# Patient Record
Sex: Male | Born: 1965 | Race: Black or African American | Hispanic: No | Marital: Single | Smoking: Never smoker
Health system: Southern US, Community
[De-identification: ages and names within clinical notes are randomized; demographics above are authoritative.]

---

## 2007-11-06 ENCOUNTER — Emergency Department (HOSPITAL_COMMUNITY): Admission: EM | Admit: 2007-11-06 | Discharge: 2007-11-07 | Payer: Self-pay | Admitting: Emergency Medicine

## 2009-02-23 ENCOUNTER — Encounter: Admission: RE | Admit: 2009-02-23 | Discharge: 2009-02-23 | Payer: Self-pay | Admitting: Occupational Medicine

## 2009-02-27 ENCOUNTER — Emergency Department (HOSPITAL_COMMUNITY): Admission: EM | Admit: 2009-02-27 | Discharge: 2009-02-27 | Payer: Self-pay | Admitting: Family Medicine

## 2010-01-19 ENCOUNTER — Emergency Department (HOSPITAL_COMMUNITY): Admission: EM | Admit: 2010-01-19 | Discharge: 2010-01-20 | Payer: Self-pay | Admitting: Emergency Medicine

## 2010-01-22 ENCOUNTER — Inpatient Hospital Stay (HOSPITAL_COMMUNITY): Admission: EM | Admit: 2010-01-22 | Discharge: 2010-01-23 | Payer: Self-pay | Admitting: Emergency Medicine

## 2010-09-30 LAB — COMPREHENSIVE METABOLIC PANEL
ALT: 30 U/L (ref 0–53)
Alkaline Phosphatase: 67 U/L (ref 39–117)
CO2: 13 mEq/L — ABNORMAL LOW (ref 19–32)
CO2: 21 mEq/L (ref 19–32)
Calcium: 9.2 mg/dL (ref 8.4–10.5)
Chloride: 102 mEq/L (ref 96–112)
Chloride: 99 mEq/L (ref 96–112)
Creatinine, Ser: 1.32 mg/dL (ref 0.4–1.5)
GFR calc Af Amer: >60 mL/min (ref >60)
Sodium: 134 mEq/L — ABNORMAL LOW (ref 135–145)
Sodium: 135 mEq/L (ref 135–145)
Total Bilirubin: 1 mg/dL (ref 0.3–1.2)
Total Bilirubin: 1.2 mg/dL (ref 0.3–1.2)
Total Protein: 8.7 g/dL — ABNORMAL HIGH (ref 6.0–8.3)

## 2010-09-30 LAB — URINALYSIS, ROUTINE W REFLEX MICROSCOPIC
Bilirubin Urine: NEGATIVE
Glucose, UA: >1000 mg/dL — AB
Glucose, UA: >1000 mg/dL — AB
Ketones, ur: >80 mg/dL — AB
Ketones, ur: >80 mg/dL — AB
Nitrite: NEGATIVE
Protein, ur: 100 mg/dL — AB
Specific Gravity, Urine: 1.04 — ABNORMAL HIGH (ref 1.005–1.030)
Urobilinogen, UA: 0.2 mg/dL (ref 0.0–1.0)
Urobilinogen, UA: 1 mg/dL (ref 0.0–1.0)
pH: 6 (ref 5.0–8.0)

## 2010-09-30 LAB — LIPID PANEL
Cholesterol: 142 mg/dL (ref 0–200)
Total CHOL/HDL Ratio: 5.9 RATIO

## 2010-09-30 LAB — CBC
HCT: 40.5 % (ref 39.0–52.0)
HCT: 49.4 % (ref 39.0–52.0)
MCH: 30.8 pg (ref 26.0–34.0)
MCH: 31.1 pg (ref 26.0–34.0)
MCHC: 33.5 g/dL (ref 30.0–36.0)
MCHC: 33.7 g/dL (ref 30.0–36.0)
MCV: 92.5 fL (ref 78.0–100.0)
Platelets: 189 10*3/uL (ref 150–400)
Platelets: 214 10*3/uL (ref 150–400)
RBC: 5.34 MIL/uL (ref 4.22–5.81)
RDW: 12.5 % (ref 11.5–15.5)
RDW: 12.7 % (ref 11.5–15.5)

## 2010-09-30 LAB — BASIC METABOLIC PANEL
BUN: 15 mg/dL (ref 6–23)
CO2: 15 mEq/L — ABNORMAL LOW (ref 19–32)
CO2: 19 mEq/L (ref 19–32)
CO2: 25 mEq/L (ref 19–32)
Calcium: 7.5 mg/dL — ABNORMAL LOW (ref 8.4–10.5)
Calcium: 7.8 mg/dL — ABNORMAL LOW (ref 8.4–10.5)
Chloride: 112 mEq/L (ref 96–112)
Chloride: 113 mEq/L — ABNORMAL HIGH (ref 96–112)
Creatinine, Ser: 0.68 mg/dL (ref 0.4–1.5)
GFR calc Af Amer: >60 mL/min (ref >60)
GFR calc Af Amer: >60 mL/min (ref >60)
GFR calc non Af Amer: >60 mL/min (ref >60)
Glucose, Bld: 182 mg/dL — ABNORMAL HIGH (ref 70–99)
Glucose, Bld: 259 mg/dL — ABNORMAL HIGH (ref 70–99)
Potassium: 4.2 mEq/L (ref 3.5–5.1)
Sodium: 138 mEq/L (ref 135–145)
Sodium: 138 mEq/L (ref 135–145)
Sodium: 139 mEq/L (ref 135–145)

## 2010-09-30 LAB — URINE MICROSCOPIC-ADD ON

## 2010-09-30 LAB — DIFFERENTIAL
Basophils Absolute: 0 10*3/uL (ref 0.0–0.1)
Basophils Relative: 0 % (ref 0–1)
Monocytes Relative: 9 % (ref 3–12)

## 2010-09-30 LAB — CK TOTAL AND CKMB (NOT AT ARMC)
CK, MB: 1 ng/mL (ref 0.3–4.0)
CK, MB: 1.2 ng/mL (ref 0.3–4.0)
Relative Index: INVALID (ref 0.0–2.5)
Total CK: 31 U/L (ref 7–232)
Total CK: 33 U/L (ref 7–232)
Total CK: 35 U/L (ref 7–232)

## 2010-09-30 LAB — GLUCOSE, CAPILLARY
Glucose-Capillary: 140 mg/dL — ABNORMAL HIGH (ref 70–99)
Glucose-Capillary: 172 mg/dL — ABNORMAL HIGH (ref 70–99)
Glucose-Capillary: 179 mg/dL — ABNORMAL HIGH (ref 70–99)
Glucose-Capillary: 181 mg/dL — ABNORMAL HIGH (ref 70–99)
Glucose-Capillary: 243 mg/dL — ABNORMAL HIGH (ref 70–99)

## 2010-09-30 LAB — POCT I-STAT 3, ART BLOOD GAS (G3+)
TCO2: 13 mmol/L (ref 0–100)
pCO2 arterial: 28 mmHg — ABNORMAL LOW (ref 35.0–45.0)
pO2, Arterial: 103 mmHg — ABNORMAL HIGH (ref 80.0–100.0)

## 2010-09-30 LAB — HEMOGLOBIN A1C: Mean Plasma Glucose: 430 mg/dL — ABNORMAL HIGH (ref <117)

## 2010-09-30 LAB — TROPONIN I: Troponin I: 0.01 ng/mL (ref 0.00–0.06)

## 2010-09-30 LAB — LIPASE, BLOOD
Lipase: 22 U/L (ref 11–59)
Lipase: 31 U/L (ref 11–59)

## 2010-09-30 LAB — HEMOCCULT GUIAC POC 1CARD (OFFICE): Fecal Occult Bld: NEGATIVE

## 2010-10-20 LAB — GLUCOSE, CAPILLARY: Glucose-Capillary: 351 mg/dL — ABNORMAL HIGH (ref 70–99)

## 2011-03-24 ENCOUNTER — Emergency Department (HOSPITAL_COMMUNITY)
Admission: EM | Admit: 2011-03-24 | Discharge: 2011-03-24 | Disposition: A | Discharge status: Home / Self Care | Payer: BC Managed Care – PPO | Attending: Emergency Medicine | Admitting: Emergency Medicine

## 2011-03-24 DIAGNOSIS — X12XXXA Contact with other hot fluids, initial encounter: Secondary | ICD-10-CM | POA: Insufficient documentation

## 2011-03-24 DIAGNOSIS — T310 Burns involving less than 10% of body surface: Secondary | ICD-10-CM | POA: Insufficient documentation

## 2011-03-24 DIAGNOSIS — Z794 Long term (current) use of insulin: Secondary | ICD-10-CM | POA: Insufficient documentation

## 2011-03-24 DIAGNOSIS — T22239A Burn of second degree of unspecified upper arm, initial encounter: Secondary | ICD-10-CM | POA: Insufficient documentation

## 2011-03-24 DIAGNOSIS — E119 Type 2 diabetes mellitus without complications: Secondary | ICD-10-CM | POA: Insufficient documentation

## 2011-03-24 DIAGNOSIS — Y9289 Other specified places as the place of occurrence of the external cause: Secondary | ICD-10-CM | POA: Insufficient documentation

## 2011-06-05 IMAGING — CT CT ABD-PELV W/ CM
2 of 5 series · 14 of 32 positions shown, 19 images · IV contrast (agent unspecified)
Comparison: None.

CLINICAL DATA: Lower abdominal pain, nausea, vomiting

CT ABDOMEN AND PELVIS WITH CONTRAST
TECHNIQUE: Multidetector CT imaging of the abdomen and pelvis was
performed following the standard protocol during bolus
administration of intravenous contrast. Breast shield utilized.
Sagittal and coronal MPR images reconstructed from axial data set.
Contrast: 80 ml Xmnipaque-2JJ IV, dilute oral contrast

[Series 2: routine abdomen · axial · 0.70mm/px · z∈[-432,-82]mm · 7 of 94 slices shown, 12 images]
[im 12/94  soft-tissue]
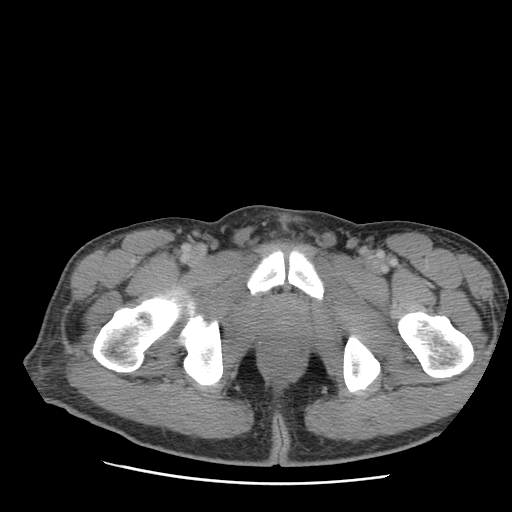
[im 12/94  bone]
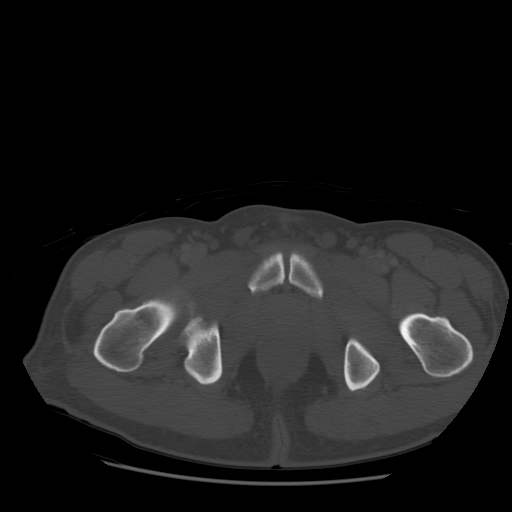
[im 24/94  soft-tissue]
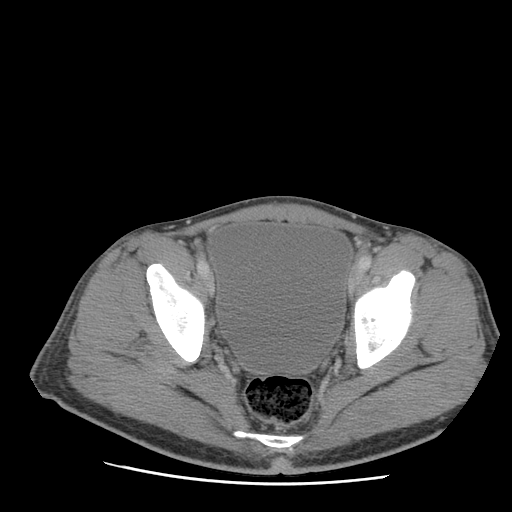
[im 35/94  soft-tissue]
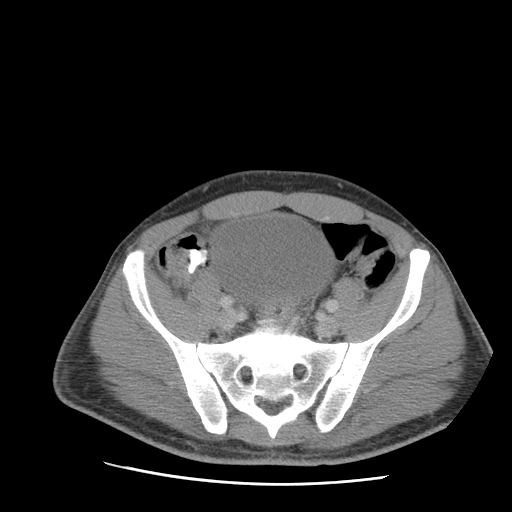
[im 47/94  soft-tissue]
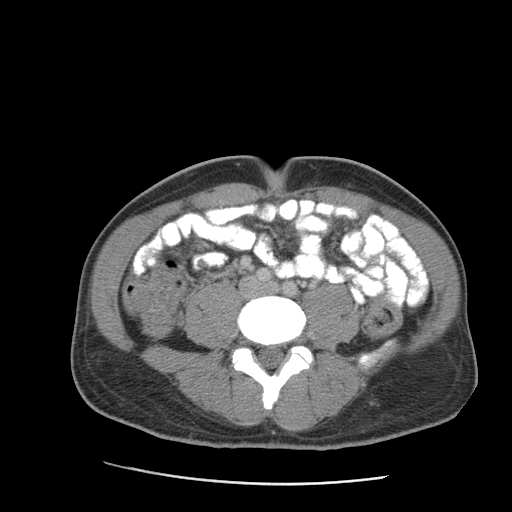
[im 47/94  lung]
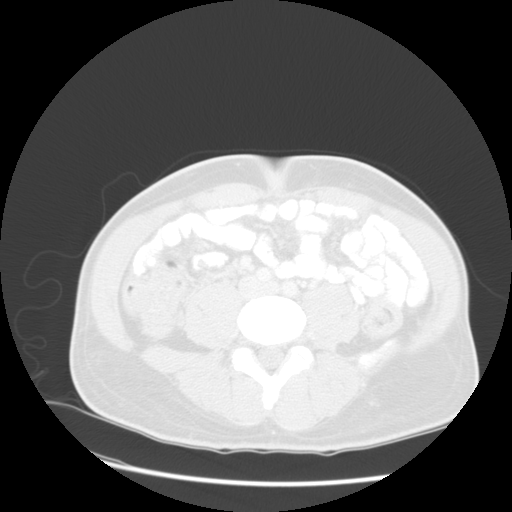
[im 59/94  soft-tissue]
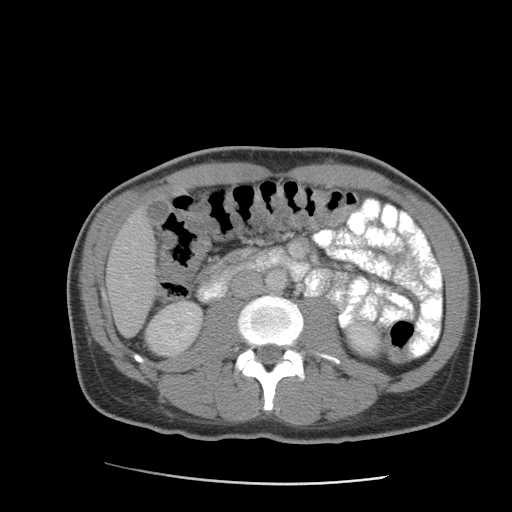
[im 59/94  lung]
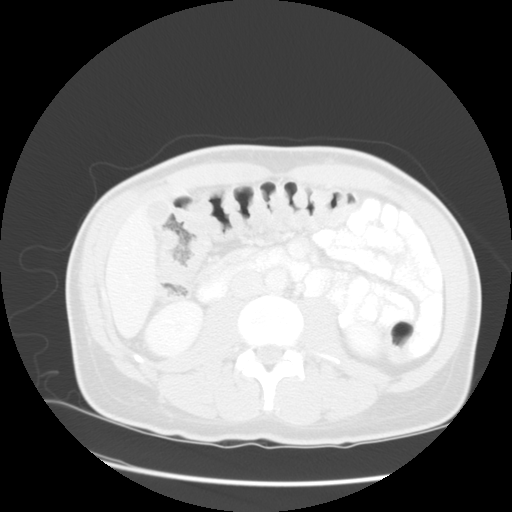
[im 70/94  soft-tissue]
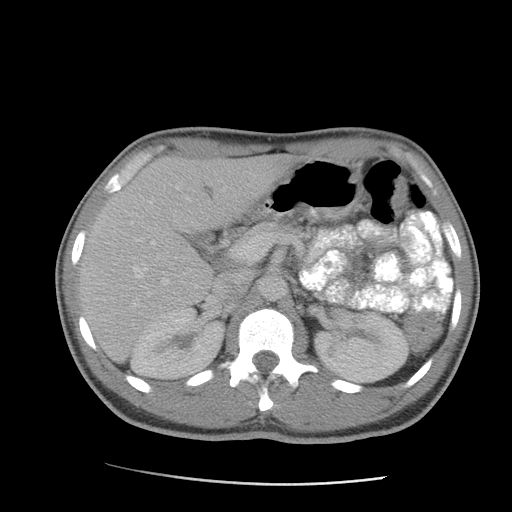
[im 70/94  lung]
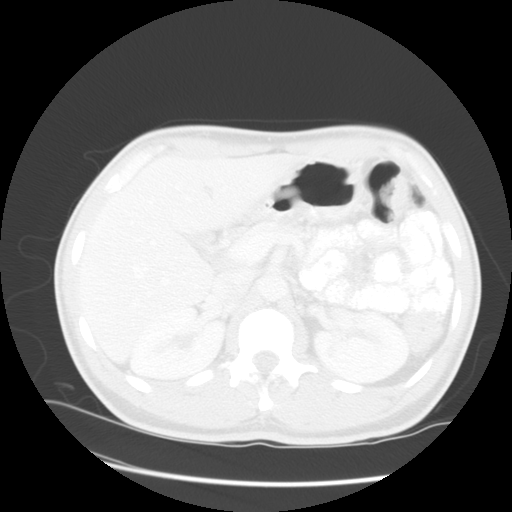
[im 82/94  soft-tissue]
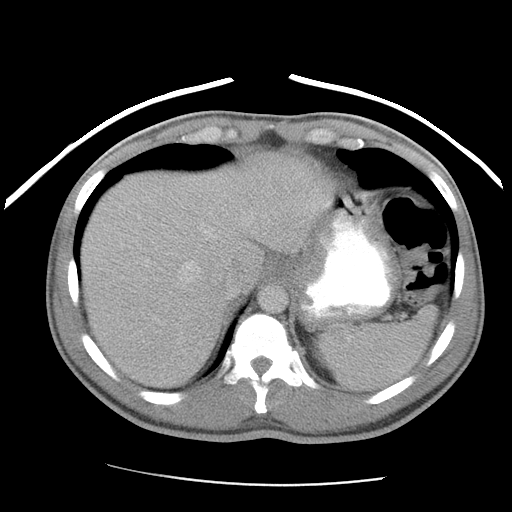
[im 82/94  lung]
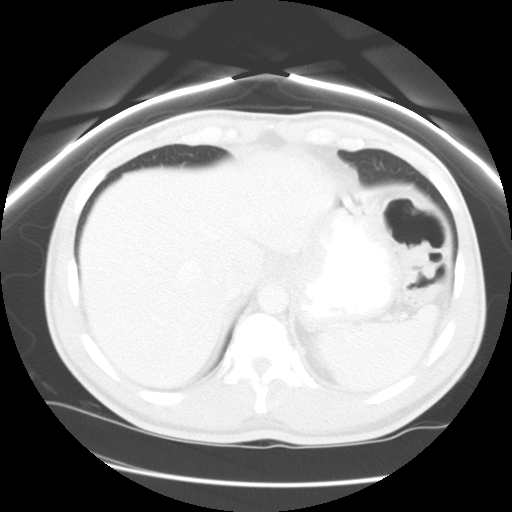

[Series 400: reformatted · sagittal · 0.93mm/px · 7 of 102 slices shown]
[im 12/102  soft-tissue]
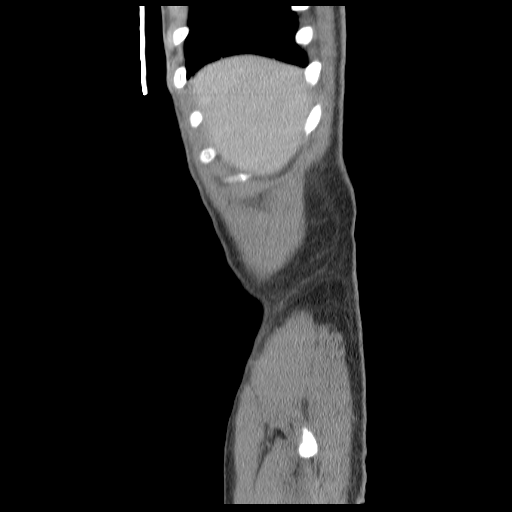
[im 23/102  soft-tissue]
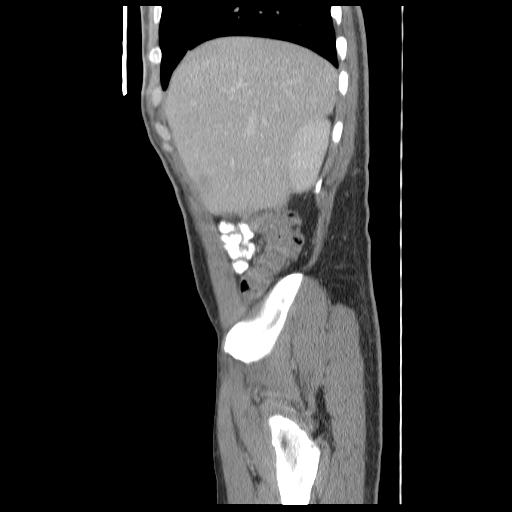
[im 34/102  soft-tissue]
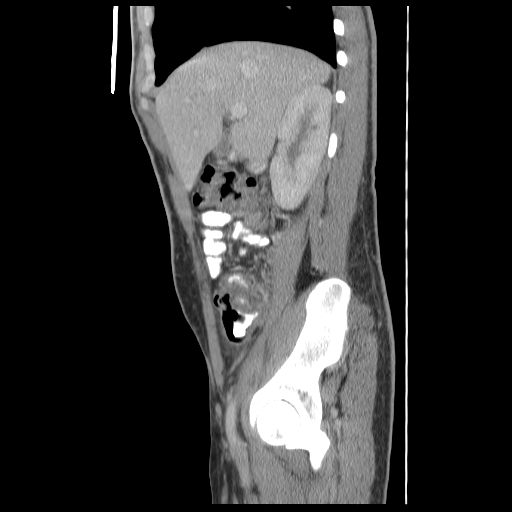
[im 45/102  soft-tissue]
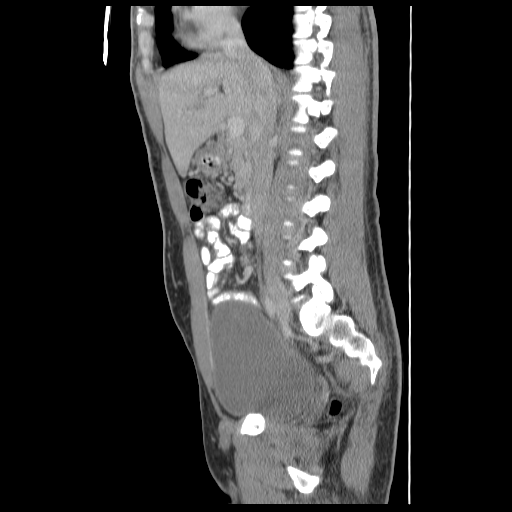
[im 57/102  soft-tissue]
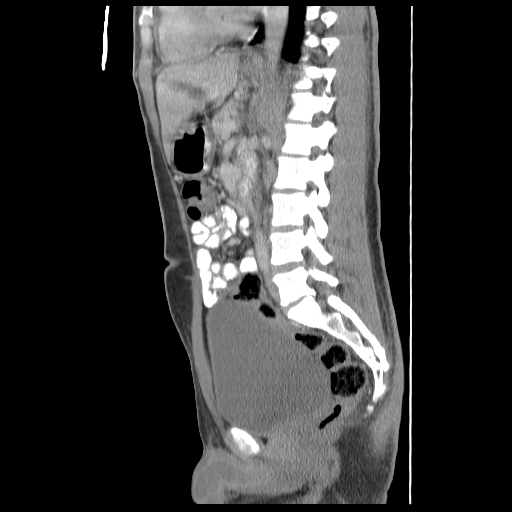
[im 68/102  soft-tissue]
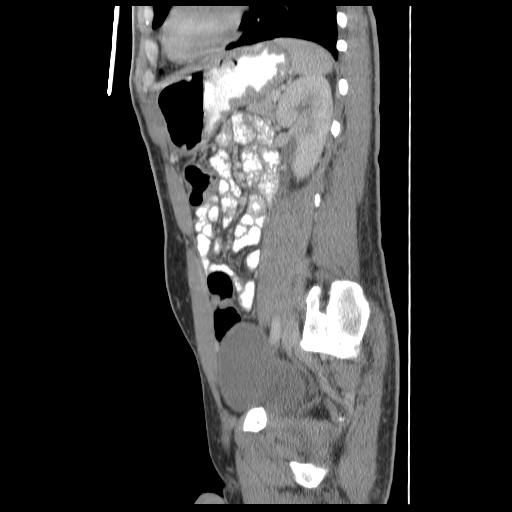
[im 79/102  soft-tissue]
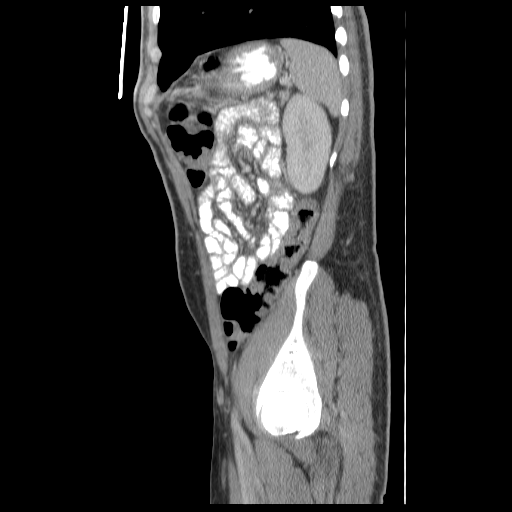

[14 of 32 positions shown; findings below may reference images not displayed]

FINDINGS: Lung bases clear.
Liver, spleen, pancreas, and adrenal glands normal appearance.
Symmetric nephrograms with extrarenal pelvis left kidney.
Well-distended normal-appearing urinary bladder.
Appendix appears coiled adjacent the cecum, grossly normal
appearance.
Colon wall is prominent thickness at scattered sites, most
prominent at the cecum and ascending colon, colitis not completely
excluded though this could represent underdistension artifact.
Small bowel loops unremarkable.
Few normal-sized lymph nodes in mesentery medial to the right
colon, nonspecific.
No mass, adenopathy, free fluid, or inflammatory process.
Scattered pelvic phleboliths.
No acute bony findings.
IMPRESSION: No definite acute intra abdominal abnormalities.
Questionable wall thickening versus underdistension artifact of
cecum and ascending colon, unable to completely exclude colitis.

## 2012-03-07 ENCOUNTER — Encounter (HOSPITAL_COMMUNITY): Payer: Self-pay | Admitting: Family Medicine

## 2012-03-07 ENCOUNTER — Emergency Department (HOSPITAL_COMMUNITY)
Admission: EM | Admit: 2012-03-07 | Discharge: 2012-03-07 | Disposition: A | Discharge status: Home / Self Care | Payer: BC Managed Care – PPO | Attending: Emergency Medicine | Admitting: Emergency Medicine

## 2012-03-07 DIAGNOSIS — B9789 Other viral agents as the cause of diseases classified elsewhere: Secondary | ICD-10-CM | POA: Insufficient documentation

## 2012-03-07 DIAGNOSIS — E119 Type 2 diabetes mellitus without complications: Secondary | ICD-10-CM | POA: Insufficient documentation

## 2012-03-07 DIAGNOSIS — J029 Acute pharyngitis, unspecified: Secondary | ICD-10-CM | POA: Insufficient documentation

## 2012-03-07 DIAGNOSIS — Z794 Long term (current) use of insulin: Secondary | ICD-10-CM | POA: Insufficient documentation

## 2012-03-07 MED ORDER — HYDROCODONE-ACETAMINOPHEN 7.5-500 MG/15ML PO SOLN: 15.0000 mL | Freq: Four times a day (QID) | ORAL | Status: DC | PRN

## 2012-03-07 NOTE — ED Provider Notes (Signed)
Medical screening examination/treatment/procedure(s) were performed by non-physician practitioner and as supervising physician I was immediately available for consultation/collaboration.   Richardean Canal, MD 03/07/12 2017

## 2012-03-07 NOTE — ED Provider Notes (Signed)
History     CSN: [id number]  Arrival date & time 03/07/12  1631   First MD Initiated Contact with Patient 03/07/12 1654      Chief Complaint  Patient presents with  . Sore Throat    (Consider location/radiation/quality/duration/timing/severity/associated sxs/prior treatment) HPI  46 year old male presents c/o sore throat.  Reports 2 days gradual onset of throat irritation. Describe sensation as a soreness, worsening with swallowing and eating. Pain is to the back of his throat. Endorse chills, and neck pain, with a mild headache. Denies ear pain, sneezing, coughing, chest pain, shortness of breath, or rash. Has tried over-the-counter throat spray with minimal relief.  Patient is a nonsmoker. He denies abdominal pain.  Past Medical History  Diagnosis Date  . Diabetes mellitus     History reviewed. No pertinent past surgical history.  History reviewed. No pertinent family history.  History  Substance Use Topics  . Smoking status: Never Smoker   . Smokeless tobacco: Not on file  . Alcohol Use: No      Review of Systems  Constitutional: Positive for chills. Negative for fever.  HENT: Positive for sore throat, trouble swallowing and neck pain. Negative for ear pain, facial swelling, sneezing, mouth sores, neck stiffness and voice change.   Skin: Negative for rash.  Neurological: Negative for speech difficulty.    Allergies  Review of patient's allergies indicates no known allergies.  Home Medications   Current Outpatient Rx  Name Route Sig Dispense Refill  . ASPIRIN EC 81 MG PO TBEC Oral Take 81 mg by mouth daily.    . INSULIN ASPART 100 UNIT/ML [place] SOLN Subcutaneous Inject 6-7 Units into the skin 3 (three) times daily before meals.    Marland Kitchen SIMVASTATIN 5 MG PO TABS Oral Take 5 mg by mouth at bedtime.      BP 161/84  Pulse 84  Temp 99.8 F (37.7 C) (Oral)  Resp 20  SpO2 100%  Physical Exam  Nursing note and vitals reviewed. Constitutional: He is oriented to  person, place, and time. He appears well-developed and well-nourished. No distress.  HENT:  Head: Normocephalic and atraumatic.  Right Ear: External ear normal.  Left Ear: External ear normal.  Nose: Nose normal.  Mouth/Throat: Oropharynx is clear and moist. No oropharyngeal exudate.       Oral pharyngeal mildly erythematous without exudates, or tonsillar enlargement. Uvula mildly edematous. No evidence of peritonsillar abscess, or Ludwig angina  Eyes: Conjunctivae are normal.  Neck: Normal range of motion. Neck supple. No tracheal deviation present.  Cardiovascular: Normal rate.   Pulmonary/Chest: Effort normal and breath sounds normal. No stridor. No respiratory distress. He exhibits no tenderness.  Abdominal: Soft. He exhibits no mass. There is no tenderness.  Lymphadenopathy:    He has cervical adenopathy.  Neurological: He is alert and oriented to person, place, and time.  Skin: Skin is warm. No rash noted.  Psychiatric: He has a normal mood and affect.    ED Course  Procedures (including critical care time)   Labs Reviewed  RAPID STREP SCREEN   No results found.   No diagnosis found.  Results for orders placed during the hospital encounter of 03/07/12  RAPID STREP SCREEN      Component Value Range   Streptococcus, Group A Screen (Direct) NEGATIVE  NEGATIVE   No results found.  1. Viral pharyngitis  MDM  Sore throat x2 days. Patient is afebrile. Vital signs stable. No evidence of airway obstruction. Strep test ordered  5:19 PM Strep test neg.  Care instruction given.  Lortab elixir prescribed.  ENT referral given.  Pt able to tolerates PO.    Fayrene Helper, PA-C 03/07/12 1720

## 2012-03-07 NOTE — ED Notes (Signed)
sts 2 days of sore throat and hard to swallow. Pt also complaining of bite to lip.

## 2012-03-09 ENCOUNTER — Encounter (HOSPITAL_COMMUNITY): Payer: Self-pay | Admitting: Family Medicine

## 2012-03-09 ENCOUNTER — Emergency Department (HOSPITAL_COMMUNITY)
Admission: EM | Admit: 2012-03-09 | Discharge: 2012-03-09 | Disposition: A | Discharge status: Home / Self Care | Payer: BC Managed Care – PPO | Attending: Emergency Medicine | Admitting: Emergency Medicine

## 2012-03-09 DIAGNOSIS — E119 Type 2 diabetes mellitus without complications: Secondary | ICD-10-CM | POA: Insufficient documentation

## 2012-03-09 DIAGNOSIS — K122 Cellulitis and abscess of mouth: Secondary | ICD-10-CM | POA: Insufficient documentation

## 2012-03-09 DIAGNOSIS — J029 Acute pharyngitis, unspecified: Secondary | ICD-10-CM | POA: Insufficient documentation

## 2012-03-09 DIAGNOSIS — Z79899 Other long term (current) drug therapy: Secondary | ICD-10-CM | POA: Insufficient documentation

## 2012-03-09 DIAGNOSIS — Z794 Long term (current) use of insulin: Secondary | ICD-10-CM | POA: Insufficient documentation

## 2012-03-09 LAB — RAPID STREP SCREEN (MED CTR MEBANE ONLY): Streptococcus, Group A Screen (Direct): NEGATIVE

## 2012-03-09 MED ORDER — PREDNISONE 20 MG PO TABS: ORAL_TABLET | ORAL | Status: AC

## 2012-03-09 MED ORDER — PREDNISONE 20 MG PO TABS: ORAL_TABLET | ORAL | Status: DC

## 2012-03-09 MED ORDER — AMOXICILLIN 500 MG PO CAPS: 500.0000 mg | ORAL_CAPSULE | Freq: Three times a day (TID) | ORAL | Status: AC

## 2012-03-09 MED ORDER — AMOXICILLIN 500 MG PO CAPS: 500.0000 mg | ORAL_CAPSULE | Freq: Three times a day (TID) | ORAL | Status: DC

## 2012-03-09 NOTE — ED Provider Notes (Signed)
History     CSN: [id number]  Arrival date & time 03/09/12  1446   First MD Initiated Contact with Patient 03/09/12 1517      Chief Complaint  Patient presents with  . Sore Throat    (Consider location/radiation/quality/duration/timing/severity/associated sxs/prior treatment) HPI Comments: 46 y/o male presents with sore throat since Thursday. Was seen in ED on Saturday and diagnosed with viral pharyngitis. He was given liquid hydrocodone-acetaminophen which only provides temporary relief and he thinks his throat is getting worse. Admits to associated fever, chills, and discomfort with swallowing. Denies any cough, congestion, chest pain, sob, rashes. Also tried many OTC cold medications without relief.  The history is provided by the patient.    Past Medical History  Diagnosis Date  . Diabetes mellitus     History reviewed. No pertinent past surgical history.  History reviewed. No pertinent family history.  History  Substance Use Topics  . Smoking status: Never Smoker   . Smokeless tobacco: Not on file  . Alcohol Use: No      Review of Systems  Constitutional: Positive for fever and chills.  HENT: Positive for sore throat. Negative for congestion.        Positive for discomfort with swallowing  Respiratory: Negative for shortness of breath.   Cardiovascular: Negative for chest pain.  Skin: Negative for rash.    Allergies  Review of patient's allergies indicates no known allergies.  Home Medications   Current Outpatient Rx  Name Route Sig Dispense Refill  . ASPIRIN EC 81 MG PO TBEC Oral Take 81 mg by mouth daily.    Marland Kitchen HYDROCODONE-ACETAMINOPHEN 7.5-500 MG/15ML PO SOLN Oral Take 15 mLs by mouth every 6 (six) hours as needed. For pain    . INSULIN ASPART 100 UNIT/ML [place] SOLN Subcutaneous Inject 6-7 Units into the skin 3 (three) times daily before meals.    Marland Kitchen SIMVASTATIN 5 MG PO TABS Oral Take 5 mg by mouth at bedtime.    . AMOXICILLIN 500 MG PO CAPS Oral Take 1  capsule (500 mg total) by mouth 3 (three) times daily. 21 capsule 0  . PREDNISONE 20 MG PO TABS  Take 3 tabs PO x 2 days followed by 2 tabs PO x 2 days followed by 1 tab PO x 2 days 12 tablet 0    BP 139/88  Pulse 114  Temp 101.2 F (38.4 C) (Oral)  Resp 16  Ht 5\' 8"  (1.727 m)  Wt 160 lb (72.576 kg)  BMI 24.33 kg/m2  SpO2 99%  Physical Exam  Constitutional: He is oriented to person, place, and time. He appears well-developed and well-nourished. No distress.  HENT:  Head: Normocephalic and atraumatic.  Nose: Nose normal.  Mouth/Throat: Uvula is midline and mucous membranes are normal. Uvula swelling present. Posterior oropharyngeal edema and posterior oropharyngeal erythema present. No oropharyngeal exudate or tonsillar abscesses.  Eyes: Conjunctivae and EOM are normal.  Neck: Trachea normal. Neck supple.  Cardiovascular: Regular rhythm and normal heart sounds.  Tachycardia present.   Pulmonary/Chest: Effort normal and breath sounds normal.  Lymphadenopathy:       Head (right side): Submandibular and tonsillar adenopathy present.       Head (left side): Submandibular and tonsillar adenopathy present.  Neurological: He is alert and oriented to person, place, and time.  Skin: Skin is warm and dry. No rash noted.  Psychiatric: He has a normal mood and affect. His speech is normal and behavior is normal.    ED Course  Procedures (  including critical care time)   Labs Reviewed  RAPID STREP SCREEN   Results for orders placed during the hospital encounter of 03/09/12  RAPID STREP SCREEN      Component Value Range   Streptococcus, Group A Screen (Direct) NEGATIVE  NEGATIVE    No results found.   1. Pharyngitis   2. Uvulitis       MDM  46 y/o male with sore throat. Conservative measures unsuccessful. Will treat with prednisone for uvula swelling and amoxicillin. No signs of tonsillar abscess.        Trevor Mace, PA-C 03/09/12 1552

## 2012-03-09 NOTE — ED Notes (Signed)
C/o sore throat since Thursday, states progressively worsening. Started fever & now c/o h/a today

## 2012-03-09 NOTE — ED Notes (Signed)
Per pt was seen here on Saturday for the same. sts sore throat, swelling and very hard to swallow. sts has gotten worse.

## 2012-03-10 NOTE — ED Provider Notes (Signed)
Medical screening examination/treatment/procedure(s) were performed by non-physician practitioner and as supervising physician I was immediately available for consultation/collaboration.   Teigan Sahli Y. Santresa Levett, MD 03/10/12 1621 

## 2012-04-23 ENCOUNTER — Emergency Department (HOSPITAL_COMMUNITY)
Admission: EM | Admit: 2012-04-23 | Discharge: 2012-04-23 | Disposition: A | Discharge status: Home / Self Care | Payer: BC Managed Care – PPO | Attending: Emergency Medicine | Admitting: Emergency Medicine

## 2012-04-23 ENCOUNTER — Emergency Department (HOSPITAL_COMMUNITY): Payer: BC Managed Care – PPO

## 2012-04-23 ENCOUNTER — Encounter (HOSPITAL_COMMUNITY): Payer: Self-pay | Admitting: Emergency Medicine

## 2012-04-23 DIAGNOSIS — Z794 Long term (current) use of insulin: Secondary | ICD-10-CM | POA: Insufficient documentation

## 2012-04-23 DIAGNOSIS — E1169 Type 2 diabetes mellitus with other specified complication: Secondary | ICD-10-CM | POA: Insufficient documentation

## 2012-04-23 DIAGNOSIS — L03119 Cellulitis of unspecified part of limb: Secondary | ICD-10-CM | POA: Insufficient documentation

## 2012-04-23 DIAGNOSIS — Z7982 Long term (current) use of aspirin: Secondary | ICD-10-CM | POA: Insufficient documentation

## 2012-04-23 DIAGNOSIS — R739 Hyperglycemia, unspecified: Secondary | ICD-10-CM

## 2012-04-23 DIAGNOSIS — L02619 Cutaneous abscess of unspecified foot: Secondary | ICD-10-CM | POA: Insufficient documentation

## 2012-04-23 DIAGNOSIS — L03115 Cellulitis of right lower limb: Secondary | ICD-10-CM

## 2012-04-23 LAB — POCT I-STAT, CHEM 8
BUN: 14 mg/dL (ref 6–23)
Calcium, Ion: 1.24 mmol/L — ABNORMAL HIGH (ref 1.12–1.23)
Chloride: 103 mEq/L (ref 96–112)
Creatinine, Ser: 0.8 mg/dL (ref 0.50–1.35)
Glucose, Bld: 505 mg/dL — ABNORMAL HIGH (ref 70–99)
HCT: 42 % (ref 39.0–52.0)
Potassium: 4.6 mEq/L (ref 3.5–5.1)
TCO2: 25 mmol/L (ref 0–100)

## 2012-04-23 MED ORDER — CEPHALEXIN 250 MG PO CAPS
500.0000 mg | ORAL_CAPSULE | Freq: Once | ORAL | Status: AC
  Administered 2012-04-23: 500 mg via ORAL
  Filled 2012-04-23: qty 2

## 2012-04-23 MED ORDER — CEPHALEXIN 500 MG PO CAPS: 500.0000 mg | ORAL_CAPSULE | Freq: Four times a day (QID) | ORAL | Status: AC

## 2012-04-23 MED ORDER — HYDROCODONE-ACETAMINOPHEN 5-325 MG PO TABS: 1.0000 | ORAL_TABLET | ORAL | Status: AC | PRN

## 2012-04-23 NOTE — ED Provider Notes (Signed)
History     CSN: [id number]  Arrival date & time 04/23/12  0003   First MD Initiated Contact with Patient 04/23/12 [phone]      Chief Complaint  Patient presents with  . Foot Pain    (Consider location/radiation/quality/duration/timing/severity/associated sxs/prior treatment) HPI History provided by pt.   Pt had mild pain in right 4th toe while at work this evening and attributed to friction from his boot.  Woke at mid-night w/ severe pain in this toe as well as dorsal surface of forefoot, with associated edema.  Denies fever.  Pain aggravated by bearing weight and has to walk on the lateral side of foot.  Denies trauma. Pt has insulin-dependent diabetes.    Past Medical History  Diagnosis Date  . Diabetes mellitus     History reviewed. No pertinent past surgical history.  History reviewed. No pertinent family history.  History  Substance Use Topics  . Smoking status: Never Smoker   . Smokeless tobacco: Not on file  . Alcohol Use: No      Review of Systems  All other systems reviewed and are negative.    Allergies  Review of patient's allergies indicates no known allergies.  Home Medications   Current Outpatient Rx  Name Route Sig Dispense Refill  . ASPIRIN EC 81 MG PO TBEC Oral Take 81 mg by mouth daily.    . INSULIN ASPART 100 UNIT/ML [place] SOLN Subcutaneous Inject 6-7 Units into the skin 3 (three) times daily before meals.    Marland Kitchen SIMVASTATIN 5 MG PO TABS Oral Take 5 mg by mouth at bedtime.      BP 152/95  Pulse 97  Temp 98.5 F (36.9 C) (Oral)  Resp 18  SpO2 98%  Physical Exam  Nursing note and vitals reviewed. Constitutional: He is oriented to person, place, and time. He appears well-developed and well-nourished. No distress.  HENT:  Head: Normocephalic and atraumatic.  Eyes:       Normal appearance  Neck: Normal range of motion.  Pulmonary/Chest: Effort normal.  Musculoskeletal: Normal range of motion.       Right foot diffusely edematous.  No edema  of lower leg. Blister on dorsolateral fourth toe that is ttp.  Erythema and mild warmth to touch of this toe and dorsal forefoot.  Pain w/ passive flexion of fourth toe.  No pain w/ ROM of ankle.  2+ DP pulse and distal sensation intact.    Neurological: He is alert and oriented to person, place, and time.  Psychiatric: He has a normal mood and affect. His behavior is normal.    ED Course  Procedures (including critical care time)  Labs Reviewed  GLUCOSE, CAPILLARY - Abnormal; Notable for the following:    Glucose-Capillary 473 (*)     All other components within normal limits  POCT I-STAT, CHEM 8 - Abnormal; Notable for the following:    Glucose, Bld 505 (*)     Calcium, Ion 1.24 (*)     All other components within normal limits   Dg Foot Complete Right  04/23/2012  *RADIOLOGY REPORT*  Clinical Data: Left foot pain and swelling especially about the fourth toe, no known injury  RIGHT FOOT COMPLETE - 3+ VIEW  Comparison: None.  Findings:  No fracture or dislocation with special attention paid to the fourth digit.  Regional soft tissues are normal.  No radiopaque foreign body.  Joint spaces are preserved.  No definite erosions. Pes planus deformity suspected on this nonweightbearing radiograph.  IMPRESSION:  No fracture.   Original Report Authenticated By: Waynard Reeds, M.D.      1. Cellulitis of right foot   2. Hyperglycemia       MDM  Insulin-dependent diabetic presents w/ non-traumatic Right foot edema, erythema and pain.  S/sx most consistent w/ cellulitis.  Because patient is immunocompromised and has pain w/ weight-bearing, xray ordered to r/o osteomyelitis.  cbg 473.  Chem 8 ordered to r/o DKA.  1:06 AM   Xray neg; pt unlikely to have osteomyelitis.  Ortho tech provided him w/ crutches and keflex and vicodin prescribed.  He is not acidotic.  Just took his lantus and pm dose of novolog here in ED.  Recommended that he monitor closely at home.  Strict return precautions  discussed.       Otilio Miu, [place] 04/23/12 [phone]

## 2012-04-23 NOTE — ED Notes (Signed)
Patient transported to X-ray 

## 2012-04-23 NOTE — ED Notes (Signed)
(+)  Swelling, redness, tenderness still noted to right foot. Patient denies pain while resting.  Per patient report, pain returns when bearing weight on foot.

## 2012-04-23 NOTE — ED Notes (Signed)
Patient states he wears steel toe shoes for work.  Today when he went to work he put on his socks and boots and went to work.  When he came home he showered sat down and put his feet up ob the coffee table.  States he fell asleep for a short time and when he woke up his right foot was swollen and painful and is fourth toe has an area on the top the is yellowish in color.  His toe is very painful, unable to touch it.

## 2012-04-23 NOTE — ED Notes (Signed)
CBG=473mg /dl

## 2012-04-23 NOTE — ED Provider Notes (Signed)
Medical screening examination/treatment/procedure(s) were performed by non-physician practitioner and as supervising physician I was immediately available for consultation/collaboration.  Raeford Razor, MD 04/23/12 [phone]

## 2012-04-23 NOTE — ED Notes (Signed)
Patient reports sudden onset of swelling and pain to right foot onset at 2330 tonight.  Patient reports that pain awoke him from sleep.  Patient describes the pain as throbbing/aching/heavines in his right foot.  Pulses present in right foot and right lower extremity.  Decrease ROM noted to right foot secondary to swelling.  Moderate swelling, redness, tenderness noted to dorsal surface of right foot.  Abnormality noted to fourth digit of right foot as well.

## 2012-04-23 NOTE — ED Notes (Signed)
Patient transported from X-ray 

## 2012-04-23 NOTE — ED Notes (Signed)
Patient reports that while he was at work tonight, he started having pain in the fourth toe on the right foot while walking at work.  Patient denies injury.  After patient went home and took a shower, he noticed swelling around the toe.  Insect bite/blister noted to fourth toe on right foot.  Patient ambulatory in triage.

## 2012-04-23 NOTE — Progress Notes (Signed)
Orthopedic Tech Progress Note Patient Details:  Robert Jarvis 1966/07/08 [id number]  Ortho Devices Type of Ortho Device: Crutches   Haskell Flirt 04/23/2012, 4:25 AM

## 2015-01-25 ENCOUNTER — Emergency Department (HOSPITAL_COMMUNITY)
Admission: EM | Admit: 2015-01-25 | Discharge: 2015-01-25 | Disposition: A | Discharge status: Home / Self Care | Payer: BLUE CROSS/BLUE SHIELD | Source: Home / Self Care | Attending: Family Medicine | Admitting: Family Medicine

## 2015-01-25 ENCOUNTER — Encounter (HOSPITAL_COMMUNITY): Payer: Self-pay | Admitting: *Deleted

## 2015-01-25 DIAGNOSIS — M79671 Pain in right foot: Secondary | ICD-10-CM | POA: Diagnosis not present

## 2015-01-25 NOTE — Discharge Instructions (Signed)
See podiatrist as advised for further foot care.

## 2015-01-25 NOTE — ED Provider Notes (Signed)
CSN: [id number]     Arrival date & time 01/25/15  1351 History   First MD Initiated Contact with Patient 01/25/15 1449     Chief Complaint  Patient presents with  . Foot Problem   (Consider location/radiation/quality/duration/timing/severity/associated sxs/prior Treatment) Patient is a 49 y.o. male presenting with lower extremity pain. The history is provided by the patient.  Foot Pain This is a new problem. The current episode started yesterday. The problem has been gradually worsening. 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 The symptoms are aggravated by walking.    Past Medical History  Diagnosis Date  . Diabetes mellitus    History reviewed. No pertinent past surgical history. History reviewed. No pertinent family history. History  Substance Use Topics  . Smoking status: Never Smoker   . Smokeless tobacco: Not on file  . Alcohol Use: No    Review of Systems  Constitutional: Negative.   Musculoskeletal: Positive for gait problem.  Skin: Positive for wound.    Allergies  Review of patient's allergies indicates no known allergies.  Home Medications   Prior to Admission medications   Medication Sig Start Date End Date Taking? Authorizing Provider  aspirin EC 81 MG tablet Take 81 mg by mouth daily.    Historical Provider, MD  cephALEXin (KEFLEX) 500 MG capsule Take 1 capsule (500 mg total) by mouth 4 (four) times daily. 04/23/12   Ruby Colaatherine Schinlever, PA-C  HYDROcodone-acetaminophen (NORCO/VICODIN) 5-325 MG per tablet Take 1 tablet by mouth every 4 (four) hours as needed for pain. 04/23/12   Catherine Schinlever, PA-C  insulin aspart (NOVOLOG) 100 UNIT/ML injection Inject 6-7 Units into the skin 3 (three) times daily before meals.    Historical Provider, MD  simvastatin (ZOCOR) 5 MG tablet Take 5 mg by mouth at bedtime.    Historical Provider, MD   BP 135/85 mmHg  Pulse 102  Temp(Src) 98.5 F (36.9 C) (Oral)  Resp  16  SpO2 98% Physical Exam  Constitutional: He is oriented to person, place, and time. He appears well-developed and well-nourished. No distress.  Musculoskeletal: He exhibits tenderness.  Tender callous thickening to plantar aspect of right lat foot, no infection.  Neurological: He is alert and oriented to person, place, and time. He has normal reflexes.  Skin: Skin is warm and dry.  Nursing note and vitals reviewed.   ED Course  Debridement Date/Time: 01/25/2015 4:08 PM Performed by: Linna HoffKINDL, Annalena Piatt D Authorized by: Bradd CanaryKINDL, Qualyn Oyervides D Consent: Verbal consent obtained. Consent given by: patient Preparation: Patient was prepped and draped in the usual sterile fashion. Local anesthesia used: no Patient sedated: no Patient tolerance: Patient tolerated the procedure well with no immediate complications Comments: Sx improved in right foot.   (including critical care time) Labs Review Labs Reviewed - No data to display  Imaging Review No results found.   MDM   1. Foot pain, right        Linna HoffJames D Irva Loser, MD 01/25/15 [phone]

## 2015-01-25 NOTE — ED Notes (Signed)
Pt  Is a   Diabetic     He  Reports   He  Noticed       Pain on  The  Bottom  Of     His  r  Foot    He  Reports  Pain  When  He  Bears   Weight    He  Reports  Pain radiates    Around    The  Foot
